# Patient Record
Sex: Male | Born: 1975 | Hispanic: Yes | Marital: Married | Smoking: Never smoker
Health system: Southern US, Community
[De-identification: ages and names within clinical notes are randomized; demographics above are authoritative.]

---

## 2014-06-15 ENCOUNTER — Emergency Department: Payer: Self-pay

## 2014-06-15 ENCOUNTER — Emergency Department
Admission: EM | Admit: 2014-06-15 | Discharge: 2014-06-15 | Disposition: A | Discharge status: Home / Self Care | Payer: Self-pay | Attending: Emergency Medicine | Admitting: Emergency Medicine

## 2014-06-15 ENCOUNTER — Encounter: Payer: Self-pay | Admitting: Emergency Medicine

## 2014-06-15 DIAGNOSIS — Y998 Other external cause status: Secondary | ICD-10-CM | POA: Insufficient documentation

## 2014-06-15 DIAGNOSIS — S4991XA Unspecified injury of right shoulder and upper arm, initial encounter: Secondary | ICD-10-CM | POA: Insufficient documentation

## 2014-06-15 DIAGNOSIS — F10120 Alcohol abuse with intoxication, uncomplicated: Secondary | ICD-10-CM | POA: Insufficient documentation

## 2014-06-15 DIAGNOSIS — F10920 Alcohol use, unspecified with intoxication, uncomplicated: Secondary | ICD-10-CM

## 2014-06-15 DIAGNOSIS — Y92410 Unspecified street and highway as the place of occurrence of the external cause: Secondary | ICD-10-CM | POA: Insufficient documentation

## 2014-06-15 DIAGNOSIS — S8002XA Contusion of left knee, initial encounter: Secondary | ICD-10-CM

## 2014-06-15 DIAGNOSIS — M25511 Pain in right shoulder: Secondary | ICD-10-CM

## 2014-06-15 DIAGNOSIS — Y9389 Activity, other specified: Secondary | ICD-10-CM | POA: Insufficient documentation

## 2014-06-15 LAB — CBC
HEMATOCRIT: 42.7 % (ref 40.0–52.0)
HEMOGLOBIN: 14.4 g/dL (ref 13.0–18.0)
MCH: 28.4 pg (ref 26.0–34.0)
MCHC: 33.6 g/dL (ref 32.0–36.0)
MCV: 84.3 fL (ref 80.0–100.0)
Platelets: 238 10*3/uL (ref 150–440)
RBC: 5.06 MIL/uL (ref 4.40–5.90)
RDW: 12.6 % (ref 11.5–14.5)
WBC: 15.7 10*3/uL — AB (ref 3.8–10.6)

## 2014-06-15 LAB — COMPREHENSIVE METABOLIC PANEL
ALT: 49 U/L (ref 17–63)
ANION GAP: 11 (ref 5–15)
AST: 54 U/L — ABNORMAL HIGH (ref 15–41)
Albumin: 4.9 g/dL (ref 3.5–5.0)
Alkaline Phosphatase: 46 U/L (ref 38–126)
BUN: 24 mg/dL — ABNORMAL HIGH (ref 6–20)
CO2: 23 mmol/L (ref 22–32)
Calcium: 9.5 mg/dL (ref 8.9–10.3)
Chloride: 108 mmol/L (ref 101–111)
Creatinine, Ser: 1.38 mg/dL — ABNORMAL HIGH (ref 0.61–1.24)
GFR calc Af Amer: >60 mL/min (ref >60)
GFR calc non Af Amer: >60 mL/min (ref >60)
Glucose, Bld: 111 mg/dL — ABNORMAL HIGH (ref 65–99)
POTASSIUM: 3.3 mmol/L — AB (ref 3.5–5.1)
SODIUM: 142 mmol/L (ref 135–145)
Total Bilirubin: 0.4 mg/dL (ref 0.3–1.2)
Total Protein: 8.5 g/dL — ABNORMAL HIGH (ref 6.5–8.1)

## 2014-06-15 LAB — ETHANOL: Alcohol, Ethyl (B): 144 mg/dL — ABNORMAL HIGH (ref <5)

## 2014-06-15 MED ORDER — IBUPROFEN 800 MG PO TABS
ORAL_TABLET | ORAL | Status: AC
  Administered 2014-06-15: 800 mg via ORAL
  Filled 2014-06-15: qty 1

## 2014-06-15 MED ORDER — IBUPROFEN 800 MG PO TABS
800.0000 mg | ORAL_TABLET | Freq: Once | ORAL | Status: AC
  Administered 2014-06-15: 800 mg via ORAL

## 2014-06-15 MED ORDER — SODIUM CHLORIDE 0.9 % IV SOLN
1000.0000 mL | Freq: Once | INTRAVENOUS | Status: AC
  Administered 2014-06-15: 1000 mL via INTRAVENOUS
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
## 2014-06-15 NOTE — ED Notes (Signed)
Pt resting quietly in bed with eyes closed, [place] [employer] at bedside

## 2014-06-15 NOTE — Discharge Instructions (Signed)
Return to the emergency department for any new or worsening condition including chest pain, trouble breathing, new or worsening pain, or any other symptoms concerning to you. Intoxicacin alcohlica (Alcohol Intoxication) La intoxicacin alcohlica se produce cuando la cantidad de alcohol que se ha consumido daa la capacidad de funcionamiento mental y fsico. El alcohol deteriora directamente la actividad qumica normal del cerebro. Beber grandes cantidades de alcohol puede conducir a Building control surveyor funcionamiento mental y en el comportamiento, y puede causar muchos efectos fsicos que pueden ser perjudiciales.  La intoxicacin alcohlica puede variar en gravedad desde leve hasta muy grave. Hay varios factores que pueden afectar el nivel de intoxicacin que se produce, como la edad de la persona, el sexo, el peso, la frecuencia de consumo de alcohol, y la presencia de otras enfermedades mdicas (como diabetes, convulsiones o enfermedades del corazn). Los niveles peligrosos de intoxicacin por alcohol pueden ocurrir [employer] personas beben grandes cantidades de alcohol en un corto periodo de tiempo ([place]). El alcohol tambin puede ser especialmente peligroso cuando se combina con ciertos medicamentos recetados o drogas "recreativas". SIGNOS Y SNTOMAS Algunos de los signos y sntomas comunes de intoxicacin leve por alcohol incluyen:  Prdida de la coordinacin.  Cambios en el estado de nimo y la conducta.  Incapacidad para razonar.  Hablar arrastrando las palabras. A medida que la intoxicacin por alcohol avanza a niveles ms graves, Zenaida Niece a Research officer, trade union otros signos y sntomas. Estos pueden ser:  Vmitos.  Confusin y alteracin de Scientist, clinical (histocompatibility and immunogenetics).  Disminucin de Engineer, manufacturing systems.  Convulsiones.  Prdida de la conciencia. DIAGNSTICO  El mdico le har una historia clnica y un examen fsico. Se le preguntar acerca de la cantidad y el tipo de alcohol que ha consumido. Se le  realizarn anlisis de sangre para medir la concentracin de alcohol en sangre. En muchos lugares, el nivel de alcohol en la sangre debe ser inferior a 80 mg / dL (4,78%) para poder conducir legalmente. Sin embargo, hay muchos efectos peligrosos del alcohol que pueden ocurrir con niveles mucho ms bajos.  TRATAMIENTO  Las personas con intoxicacin por alcohol a menudo no requieren TEFL teacher. La mayor parte de los efectos de la intoxicacin por alcohol son temporales, y desaparecen a medida que el alcohol abandona el cuerpo de forma natural. El profesional controlar su estado hasta que est lo suficientemente estable como para volver a casa. A veces se administran lquidos por va intravenosa para ayudar a evitar la deshidratacin.  INSTRUCCIONES PARA EL CUIDADO EN EL HOGAR  No conduzca vehculos despus de beber alcohol.  Mantngase hidratado. Beba gran cantidad de lquido para mantener la orina de tono claro o color amarillo plido. Evite la cafena.   Tome slo medicamentos de venta libre o recetados, segn las indicaciones del mdico.  SOLICITE ATENCIN MDICA SI:   Tiene vmitos persistentes.   No mejora luego de [employer].  Se intoxica con alcohol con frecuencia. El mdico podr ayudarlo a decidir si debe consultar a un terapeuta especializado en el abuso de sustancias. SOLICITE ATENCIN MDICA DE INMEDIATO SI:   Se siente vacilante o tembloroso cuando trata de abandonar el hbito.   Comienza a temblar de manera incontrolable (convulsiones).   Vomita sangre. Puede ser sangre de color rojo brillante o similar al sedimento del caf negro.   Maxie Better en la materia fecal. Puede ser de color rojo brillante o de aspecto alquitranado, con olor ftido.   Se siente mareado o se desmaya.  ASEGRESE DE QUE:  Comprende estas instrucciones.  Controlar su afeccin.  Recibir ayuda de inmediato si no mejora o si empeora. Document Released: 12/21/2004 Document Revised:  08/23/2012 [facility] Patient Information 2015 [place], [place]. This information is not intended to replace advice given to you by your health care provider. Make sure you discuss any questions you have with your health care provider.

## 2014-06-15 NOTE — ED Notes (Signed)
Pt is somnolent with [employer] officer at bedside.
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
